# Patient Record
Sex: Female | Born: 1975 | Race: White | Hispanic: No | Marital: Married | Smoking: Never smoker
Health system: Southern US, Community
[De-identification: ages and names within clinical notes are randomized; demographics above are authoritative.]

## PROBLEM LIST (undated history)

## (undated) DIAGNOSIS — O1410 Severe pre-eclampsia, unspecified trimester: Secondary | ICD-10-CM

## (undated) DIAGNOSIS — N809 Endometriosis, unspecified: Secondary | ICD-10-CM

## (undated) DIAGNOSIS — K219 Gastro-esophageal reflux disease without esophagitis: Secondary | ICD-10-CM

## (undated) DIAGNOSIS — N840 Polyp of corpus uteri: Secondary | ICD-10-CM

## (undated) DIAGNOSIS — F419 Anxiety disorder, unspecified: Secondary | ICD-10-CM

## (undated) HISTORY — PX: TUBAL LIGATION: SHX77

## (undated) HISTORY — DX: Polyp of corpus uteri: N84.0

## (undated) HISTORY — DX: Severe pre-eclampsia, unspecified trimester: O14.10

## (undated) HISTORY — DX: Endometriosis, unspecified: N80.9

---

## 1996-12-30 DIAGNOSIS — O1410 Severe pre-eclampsia, unspecified trimester: Secondary | ICD-10-CM

## 1996-12-30 HISTORY — DX: Severe pre-eclampsia, unspecified trimester: O14.10

## 1997-06-18 DIAGNOSIS — O1410 Severe pre-eclampsia, unspecified trimester: Secondary | ICD-10-CM

## 2007-12-31 DIAGNOSIS — N840 Polyp of corpus uteri: Secondary | ICD-10-CM

## 2007-12-31 HISTORY — DX: Polyp of corpus uteri: N84.0

## 2008-12-20 ENCOUNTER — Ambulatory Visit: Payer: Self-pay | Admitting: Unknown Physician Specialty

## 2008-12-20 HISTORY — PX: HYSTEROSCOPY W/D&C: SHX1775

## 2008-12-20 HISTORY — PX: LAPAROSCOPY: SHX197

## 2017-07-10 ENCOUNTER — Encounter: Payer: Self-pay | Admitting: Certified Nurse Midwife

## 2017-07-10 ENCOUNTER — Ambulatory Visit (INDEPENDENT_AMBULATORY_CARE_PROVIDER_SITE_OTHER): Payer: 59 | Admitting: Certified Nurse Midwife

## 2017-07-10 VITALS — BP 112/72 | HR 75 | Ht 62.0 in | Wt 123.0 lb

## 2017-07-10 DIAGNOSIS — N941 Unspecified dyspareunia: Secondary | ICD-10-CM

## 2017-07-10 DIAGNOSIS — N923 Ovulation bleeding: Secondary | ICD-10-CM | POA: Diagnosis not present

## 2017-07-10 DIAGNOSIS — Z8759 Personal history of other complications of pregnancy, childbirth and the puerperium: Secondary | ICD-10-CM

## 2017-07-10 DIAGNOSIS — Z98891 History of uterine scar from previous surgery: Secondary | ICD-10-CM

## 2017-07-10 DIAGNOSIS — N809 Endometriosis, unspecified: Secondary | ICD-10-CM

## 2017-07-10 LAB — POCT WET PREP (WET MOUNT): Trichomonas Wet Prep HPF POC: ABSENT

## 2017-07-10 NOTE — Patient Instructions (Signed)
Transvaginal Ultrasound A transvaginal ultrasound, also called an endovaginal ultrasound, is a test that uses harmless sound waves to take pictures of the female genital tract. The pictures are taken with a device, called a transducer, that is placed in the vagina. This test may be done to:  Check for problems with your pregnancy.  Examine your developing baby.  Check for anything abnormal in the uterus or ovaries.  Evaluate pelvic pain or bleeding.  Tell a health care provider about:  Any allergies you have.  All medicines you are taking, including vitamins, herbs, eye drops, creams, and over-the-counter medicines.  Any blood disorders you have.  Any surgeries you have had.  Any medical conditions you have.  Whether you are pregnant or may be pregnant.  Whether you are having your period. What are the risks? There are no known risks or complications of having this test. What happens before the procedure?  This test needs to be done when your bladder is empty. Follow your health care provider's instructions about drinking fluids and emptying your bladder before the test. What happens during the procedure?  You will empty your bladder.  You will undress from the waist down.  You will lie down on an examining table, with your knees bent and your feet in foot holders.  A health care provider will cover the transducer with a sterile condom.  A gel will be put on the transducer. The gel helps transmit the sound waves and prevents irritation to your vagina.  The technician will insert the transducer into your vagina to get images. These will be displayed on a monitor that looks like a small television screen.  The transducer will be removed when the procedure is complete. What happens after the procedure?  It is your responsibility to get your test results. Ask your health care provider or the department performing the test when your results will be ready.  Keep follow-up  visits as told by your health care provider. This is important. This information is not intended to replace advice given to you by your health care provider. Make sure you discuss any questions you have with your health care provider. Document Released: 11/27/2004 Document Revised: 08/18/2016 Document Reviewed: 05/17/2015 Elsevier Interactive Patient Education  2018 [employer] Abnormal Uterine Bleeding Abnormal uterine bleeding can affect women at various stages in life, including teenagers, women in their reproductive years, pregnant women, and women who have reached menopause. Several kinds of uterine bleeding are considered abnormal, including:  Bleeding or spotting between periods.  Bleeding after sexual intercourse.  Bleeding that is heavier or more than normal.  Periods that last longer than usual.  Bleeding after menopause.  Many cases of abnormal uterine bleeding are minor and simple to treat, while others are more serious. Any type of abnormal bleeding should be evaluated by your health care provider. Treatment will depend on the cause of the bleeding. Follow these instructions at home: Monitor your condition for any changes. The following actions may help to alleviate any discomfort you are experiencing:  Avoid the use of tampons and douches as directed by your health care provider.  Change your pads frequently.  You should get regular pelvic exams and Pap tests. Keep all follow-up appointments for diagnostic tests as directed by your health care provider. Contact a health care provider if:  Your bleeding lasts more than 1 week.  You feel dizzy at times. Get help right away if:  You pass out.  You are changing pads every  15 to 30 minutes.  You have abdominal pain.  You have a fever.  You become sweaty or weak.  You are passing large blood clots from the vagina.  You start to feel nauseous and vomit. This information is not intended to replace advice given  to you by your health care provider. Make sure you discuss any questions you have with your health care provider. Document Released: 12/16/2005 Document Revised: 05/29/2016 Document Reviewed: 07/15/2013 Elsevier Interactive Patient Education  2017 [employer]. Endometriosis Endometriosis is a condition in which the tissue that lines the uterus (endometrium) grows outside of its normal location. The tissue may grow in many locations close to the uterus, but it commonly grows on the ovaries, fallopian tubes, vagina, or bowel. When the uterus sheds the endometrium every menstrual cycle, there is bleeding wherever the endometrial tissue is located. This can cause pain because blood is irritating to tissues that are not normally exposed to it. What are the causes? The cause of endometriosis is not known. What increases the risk? You may be more likely to develop endometriosis if you:  Have a family history of endometriosis.  Have never given birth.  Started your period at age 33 or younger.  Have high levels of estrogen in your body.  Were exposed to a certain medicine (diethylstilbestrol) before you were born (in utero).  Had low birth weight.  Were born as a twin, triplet, or other multiple.  Have a BMI of less than 25. BMI is an estimate of body fat and is calculated from height and weight.  What are the signs or symptoms? Often, there are no symptoms of this condition. If you do have symptoms, they may:  Vary depending on where your endometrial tissue is growing.  Occur during your menstrual period (most common) or midcycle.  Come and go, or you may go months with no symptoms at all.  Stop with menopause.  Symptoms may include:  Pain in the back or abdomen.  Heavier bleeding during periods.  Pain during sex.  Painful bowel movements.  Infertility.  Pelvic pain.  Bleeding more than once a month.  How is this diagnosed? This condition is diagnosed based on your  symptoms and a physical exam. You may have tests, such as:  Blood tests and urine tests. These may be done to help rule out other possible causes of your symptoms.  Ultrasound, to look for abnormal tissues.  An X-ray of the lower bowel (barium enema).  An ultrasound that is done through the vagina (transvaginally).  CT scan.  MRI.  Laparoscopy. In this procedure, a lighted, pencil-sized instrument called a laparoscope is inserted into your abdomen through an incision. The laparoscope allows your health care provider to look at the organs inside your body and check for abnormal tissue to confirm the diagnosis. If abnormal tissue is found, your health care provider may remove a small piece of tissue (biopsy) to be examined under a microscope.  How is this treated? Treatment for this condition may include:  Medicines to relieve pain, such as NSAIDs.  Hormone therapy. This involves using artificial (synthetic) hormones to reduce endometrial tissue growth. Your health care provider may recommend using a hormonal form of birth control, or other medicines.  Surgery. This may be done to remove abnormal endometrial tissue. ? In some cases, tissue may be removed using a laparoscope and a laser (laparoscopic laser treatment). ? In severe cases, surgery may be done to remove the fallopian tubes, uterus, and ovaries (  hysterectomy).  Follow these instructions at home:  Take over-the-counter and prescription medicines only as told by your health care provider.  Do not drive or use heavy machinery while taking prescription pain medicine.  Try to avoid activities that cause pain, including sexual activity.  Keep all follow-up visits as told by your health care provider. This is important. Contact a health care provider if:  You have pain in the area between your hip bones (pelvic area) that occurs: ? Before, during, or after your period. ? In between your period and gets worse during your  period. ? During or after sex. ? With bowel movements or urination, especially during your period.  You have problems getting pregnant.  You have a fever. Get help right away if:  You have severe pain that does not get better with medicine.  You have severe nausea and vomiting, or you cannot eat without vomiting.  You have pain that affects only the lower, right side of your abdomen.  You have abdominal pain that gets worse.  You have abdominal swelling.  You have blood in your stool. This information is not intended to replace advice given to you by your health care provider. Make sure you discuss any questions you have with your health care provider. Document Released: 12/13/2000 Document Revised: 09/20/2016 Document Reviewed: 05/18/2016 Elsevier Interactive Patient Education  [employer].

## 2017-07-10 NOTE — Progress Notes (Signed)
Obstetrics & Gynecology Office Visit   Chief Complaint:  Chief Complaint  Patient presents with  . Menstrual Problem    History of Present Illness: 41 year old G3 P2103 WF, LMP 07/06/2017,  presents with complaints of intermenstrual bleeding x 2 months. Her menses have been monthly +/- a few days, lasting 7 days with the last few days only spotting. About one week after her menses stops, she will have spotting/ light bleeding lasting 2-3 days. The intermenstrual spotting is not accompanied by cramping. She usually has premenstrual lower back pain and/or lower abdominal pain however and when really intense, she takes Tylenol or ibuprofen with relief. Usually she uses topical anlgesics, a warm bath or a heating pad. She has dyspareunia-sometimes experiencing pain in abdomen and sometimes feeling vaginal discomfort. Her history is remarkable for endometriosis, adhesions,  and endometrial polyps diagnosed during a 2009 dx laparoscopy and D&C. She has had 3 Cesarean sections, the first was done preterm for toxemia in 1998. She had a BTL done at the time of her third Cesarean section in 2003.    Review of Systems:  Review of Systems  Constitutional: Negative for chills and fever.  Gastrointestinal: Positive for abdominal pain (premenstrual). Negative for nausea and vomiting.  Genitourinary: Negative for dysuria.       See HPI  Musculoskeletal: Positive for back pain (premenstrually).  Skin: Negative for rash.  Endo/Heme/Allergies:       Positive for heat intolerance and ?night sweats  Psychiatric/Behavioral: The patient has insomnia.        Irritability     Past Medical History:  Past Medical History:  Diagnosis Date  . Endometrial polyp 2009  . Endometriosis   . Preeclampsia, severe 1998    Past Surgical History:  Past Surgical History:  Procedure Laterality Date  . CESAREAN SECTION  06/18/1997 & 07/07/2001  . CESAREAN SECTION W/BTL  11/30/2002  . HYSTEROSCOPY W/D&C  12/20/2008   endomterila polyps  . LAPAROSCOPY  12/20/2008   CPP/ endometriosis/ lysis of adhesion    Gynecologic History: Patient's last menstrual period was 07/06/2017 (exact date).    Family History:  Family History  Problem Relation Age of Onset  . Colon cancer Maternal Uncle   . Ovarian cancer Other     Social History:  Social History   Social History  . Marital status: Married    Spouse name: N/A  . Number of children: 3  . Years of education: N/A   Occupational History  . Not on file.   Social History Main Topics  . Smoking status: Never Smoker  . Smokeless tobacco: Never Used  . Alcohol use No  . Drug use: No  . Sexual activity: Yes    Birth control/ protection: Surgical     Comment: tubal ligation   Other Topics Concern  . Not on file   Social History Narrative  . No narrative on file    Allergies:  Allergies  Allergen Reactions  . Azithromycin Rash  . Penicillin G Other (See Comments) and Rash    All cillins     Medications: Prior to Admission medications   Medication Sig Start Date End Date Taking? Authorizing Provider  cholecalciferol (VITAMIN D) 1000 units tablet Take 1,000 Units by mouth daily.   Yes [provider]    Physical Exam Vitals: BP 112/72   Pulse 75   Ht 5\' 2"  (1.575 m)   Wt 123 lb (55.8 kg)   LMP 07/06/2017 (Exact Date)   BMI  22.50 kg/m   Physical Exam  Constitutional: She is oriented to person, place, and time. She appears well-developed and well-nourished. No distress.  GI: Soft. She exhibits no distension and no mass. There is no tenderness.  Genitourinary:  Genitourinary Comments: Vulva: no lesions or inflammation Vagina: light brown discharge, no lesions Cervix: nullip, NT, no lesions Uterus: RV, NSSC, decreased mobility, essentially NT Adnexa: NT, no masses  Neurological: She is alert and oriented to person, place, and time.  Skin: Skin is warm and dry.  Psychiatric: She has a normal mood and affect. Her  behavior is normal.   Results for orders placed or performed in visit on 07/10/17 (from the past 72 hour(s))  POCT Wet Prep Mellody Drown([place]et [place])     Status: Normal   Collection Time: 07/10/17 10:23 PM  Result Value Ref Range   Source Wet Prep POC vaginal    WBC, Wet Prep HPF POC     Bacteria Wet Prep HPF POC  Few   BACTERIA WET PREP MORPHOLOGY POC     Clue Cells Wet Prep HPF POC None None   Clue Cells Wet Prep Whiff POC     Yeast Wet Prep HPF POC None    KOH Wet Prep POC     Trichomonas Wet Prep HPF POC Absent Absent    Assessment: 41 y.o. Z6X0960G3P2103 with intermenstrual spotting/bleeding x 2 months Histroy of endometriosis/ endometrial polyps/ dyspareunia  Plan: 1)Plan further evaluation of bleeding with sonohystogram with MD 2) Discussed possible etiologies of pain and bleeding 3) CBC and TSH obtained at time of annual in Sept 2017 were normal.  Farrel Connersolleen Toya Palacios, CNM

## 2017-07-13 ENCOUNTER — Encounter: Payer: Self-pay | Admitting: Certified Nurse Midwife

## 2017-07-13 DIAGNOSIS — N941 Unspecified dyspareunia: Secondary | ICD-10-CM | POA: Insufficient documentation

## 2017-07-13 DIAGNOSIS — Z98891 History of uterine scar from previous surgery: Secondary | ICD-10-CM | POA: Insufficient documentation

## 2017-07-15 ENCOUNTER — Ambulatory Visit: Payer: 59 | Admitting: Obstetrics & Gynecology

## 2017-07-15 ENCOUNTER — Other Ambulatory Visit: Payer: 59

## 2017-07-31 ENCOUNTER — Other Ambulatory Visit: Payer: Self-pay | Admitting: Obstetrics and Gynecology

## 2017-07-31 DIAGNOSIS — N923 Ovulation bleeding: Secondary | ICD-10-CM

## 2017-08-04 ENCOUNTER — Ambulatory Visit: Payer: 59 | Admitting: Obstetrics and Gynecology

## 2017-08-04 ENCOUNTER — Other Ambulatory Visit: Payer: 59

## 2017-08-14 ENCOUNTER — Other Ambulatory Visit: Payer: 59

## 2017-08-14 ENCOUNTER — Ambulatory Visit: Payer: 59 | Admitting: Obstetrics and Gynecology

## 2017-09-08 ENCOUNTER — Ambulatory Visit (INDEPENDENT_AMBULATORY_CARE_PROVIDER_SITE_OTHER): Payer: 59 | Admitting: Obstetrics and Gynecology

## 2017-09-08 ENCOUNTER — Encounter: Payer: Self-pay | Admitting: Obstetrics and Gynecology

## 2017-09-08 ENCOUNTER — Ambulatory Visit: Payer: 59 | Admitting: Obstetrics and Gynecology

## 2017-09-08 ENCOUNTER — Other Ambulatory Visit: Payer: 59

## 2017-09-08 ENCOUNTER — Ambulatory Visit (INDEPENDENT_AMBULATORY_CARE_PROVIDER_SITE_OTHER): Payer: 59

## 2017-09-08 VITALS — BP 112/66 | HR 86 | Ht 63.0 in | Wt 122.0 lb

## 2017-09-08 DIAGNOSIS — N809 Endometriosis, unspecified: Secondary | ICD-10-CM

## 2017-09-08 DIAGNOSIS — N840 Polyp of corpus uteri: Secondary | ICD-10-CM | POA: Insufficient documentation

## 2017-09-08 DIAGNOSIS — N923 Ovulation bleeding: Secondary | ICD-10-CM

## 2017-09-08 DIAGNOSIS — N921 Excessive and frequent menstruation with irregular cycle: Secondary | ICD-10-CM

## 2017-09-08 NOTE — Progress Notes (Signed)
   Sonohysterogram Procedure Note  The indications for this procedure were reviewed with the patient. The procedure was explained in detail and all questions were answered.  The patient was placed in the lithotomy position. A graves speculum was introduced into the vagina and the cervix was visualized. The cervix was prepped with iodine solution. A Cook's Hysterography catheter was then introduced into the uterine cavity and the speculum was removed.   Sterile sonohysterography with 3D Reconstruction was performed. The endometrial cavity was distended with sterile saline. The findings are as follows: possible endometrial polyp, larger lesion that could be a blood clot or membrane. No history of uterine membrane from prior hysteroscopy.  The patient tolerated the procedure well without complication, and was discharged to home.  See separate note for discussion of plan.  Thomasene MohairStephen Hyun Marsalis, MD 09/08/2017 6:01 PM

## 2017-09-08 NOTE — Progress Notes (Signed)
Gynecology Ultrasound Follow Up  Chief Complaint:  Chief Complaint  Patient presents with  . Follow-up    SHG     History of Present Illness: Patient is a 41 y.o. female who presents today for ultrasound evaluation of abnormal uterine bleeding (menorrhagia).  Ultrasound demonstrates the following findings Adnexa: no masses noted  Uterus: retroflexed with endometrial stripe  3.3 mm Additional: 10 x 7 mm likely endometrial polyp, additional finding of larger lesion that could be a septum versus well-established blood clot.   She has been having regular, monthly menses lasting 3-4 days with flow followed by a few days of spotting.  For the past several months she has been having mid-cycle spotting that lasts for 7 days.  Denies associated cramping.  She does have some lower back pain and/or lower abdominal pain that has been intense.  She has a history of endometriosis for which she has taken nothing.  She also had endometrial polyps noted when she had a hysteroscopy in 2009.  She has a history of BTL, as well.     Past Medical History:  Diagnosis Date  . Endometrial polyp 2009  . Endometriosis   . Preeclampsia, severe 1998    Past Surgical History:  Procedure Laterality Date  . CESAREAN SECTION  06/18/1997 & 07/07/2001  . CESAREAN SECTION W/BTL  11/30/2002  . HYSTEROSCOPY W/D&C  12/20/2008   endomterila polyps  . LAPAROSCOPY  12/20/2008   CPP/ endometriosis/ lysis of adhesion    Family History  Problem Relation Age of Onset  . Ovarian cancer Other   . COPD Father   . Stroke Paternal Grandfather [id number]  . Colon cancer Paternal Uncle 60       3 of 4 children were tested for genetic mutation-all negative  . Colon cancer Cousin 37       siblings genetic tests were negative  . Congenital heart disease Daughter        Tetrology of Fallot-surgically corrected age 42    Social History   Social History  . Marital status: Married    Spouse name: N/A  . Number of children: 3    . Years of education: N/A   Occupational History  . Not on file.   Social History Main Topics  . Smoking status: Never Smoker  . Smokeless tobacco: Never Used  . Alcohol use No  . Drug use: No  . Sexual activity: Yes    Birth control/ protection: Surgical     Comment: tubal ligation   Other Topics Concern  . Not on file   Social History Narrative  . No narrative on file    Allergies  Allergen Reactions  . Azithromycin Rash  . Penicillin G Other (See Comments) and Rash    All cillins     Prior to Admission medications   Medication Sig Start Date End Date Taking? Authorizing Provider  cholecalciferol (VITAMIN D) 1000 units tablet Take 1,000 Units by mouth daily.   Yes [provider]    Physical Exam BP 112/66 (BP Location: Left Arm, Patient Position: Sitting, Cuff Size: Normal)   Pulse 86   Ht 5\' 3"  (1.6 m)   Wt 122 lb (55.3 kg)   LMP 09/02/2017   BMI 21.61 kg/m    General: NAD HEENT: normocephalic, anicteric Pulmonary: No increased work of breathing Extremities: no edema, erythema, or tenderness Neurologic: Grossly intact, normal gait Psychiatric: mood appropriate, affect full  Assessment: 41 y.o. [id number] with menorrhagia and irregular cycle, as well  as endometrial polyp.  Plan: Problem List Items Addressed This Visit    Endometriosis determined by laparoscopy   Menorrhagia with irregular cycle - Primary   Relevant Orders   [place] Sonohysterogram   Endometrial polyp   Relevant Orders   [place] Sonohysterogram     I spent 15 minutes discussing her abnormal findings and discussion of management options. Ultimately decision was made to undergo surgery with hysteroscopy, dilation and curettage, as well as polypectomy.  Will schedule.  Thomasene Mohair, MD 09/08/2017 6:09 PM

## 2017-09-09 ENCOUNTER — Telehealth: Payer: Self-pay | Admitting: Obstetrics and Gynecology

## 2017-09-09 NOTE — Telephone Encounter (Signed)
Upcoming available OR dates given. Patient prefers a Thursday; will check calendar and call back, most likely tomorrow. Ext given.

## 2017-09-09 NOTE — Telephone Encounter (Signed)
-----   Message from Conard NovakStephen D Jackson, MD sent at 09/08/2017  6:09 PM EDT ----- Regarding: Schedule Surgery Surgery Booking Request Patient Full Name:   MRN: [id number]  DOB: 10/19/1976  Surgeon: Thomasene MohairStephen Jackson, MD  Requested Surgery Date and Time: within next 30 days Primary Diagnosis AND Code: menorrhagia with irregular cycle, endometrial polyp Secondary Diagnosis and Code:  Surgical Procedure: hysteroscopy, dilation and curettage, polypectomy L&D Notification: No Admission Status: same day surgery Length of Surgery: 40 minutes Special Case Needs: MyoSure device H&P: TBD (date) (can sign consents pre-op at hospital) Phone Interview???: yes Interpreter: Language:  Medical Clearance: none required Special Scheduling Instructions: none

## 2017-09-10 NOTE — Telephone Encounter (Signed)
Patient l/m that she would like to have the surgery on 10/02/17. Patient would like to know her estimated recovery time. No answer, v/m not set up.

## 2017-09-11 NOTE — Telephone Encounter (Signed)
Patient is aware of Pre-admit Testing phone interview on 09/25/17 1-5pm and recovery time. Patient will let us know if she needs anything for work.

## 2017-09-25 ENCOUNTER — Encounter
Admission: RE | Admit: 2017-09-25 | Discharge: 2017-09-25 | Disposition: A | Discharge status: Home / Self Care | Payer: 59 | Source: Ambulatory Visit | Attending: Obstetrics and Gynecology | Admitting: Obstetrics and Gynecology

## 2017-09-25 HISTORY — DX: Anxiety disorder, unspecified: F41.9

## 2017-09-25 HISTORY — DX: Gastro-esophageal reflux disease without esophagitis: K21.9

## 2017-09-25 NOTE — Patient Instructions (Signed)
Your procedure is scheduled on: 10-02-17  Report to Same Day Surgery 2nd floor medical mall [facility] Entrance-take elevator on left to 2nd floor.  Check in with surgery information desk.) To find out your arrival time please call [phone] between 1PM - 3PM on 10-01-17  Remember: Instructions that are not followed completely may result in serious medical risk, up to and including death, or upon the discretion of your surgeon and anesthesiologist your surgery may need to be rescheduled.    _x___ 1. Do not eat food after midnight the night before your procedure. You may drink clear liquids up to 2 hours before you are scheduled to arrive at the hospital for your procedure.  Do not drink clear liquids within 2 hours of your scheduled arrival to the hospital.  Clear liquids include  --Water or Apple juice without pulp  --Clear carbohydrate beverage such as ClearFast or Gatorade  --Black Coffee or Clear Tea (No milk, no creamers, do not add anything to  the coffee or Tea Type 1 and type 2 diabetics should only drink water.  No gum chewing or hard candies.     __x__ 2. No Alcohol for 24 hours before or after surgery.   __x__3. No Smoking for 24 prior to surgery.   ____  4. Bring all medications with you on the day of surgery if instructed.    __x__ 5. Notify your doctor if there is any change in your medical condition     (cold, fever, infections).     Do not wear jewelry, make-up, hairpins, clips or nail polish.  Do not wear lotions, powders, or perfumes. You may wear deodorant.  Do not shave 48 hours prior to surgery. Men may shave face and neck.  Do not bring valuables to the hospital.    [facility] is not responsible for any belongings or valuables.               Contacts, dentures or bridgework may not be worn into surgery.  Leave your suitcase in the car. After surgery it may be brought to your room.  For patients admitted to the hospital, discharge time is determined by  your                       treatment team.   Patients discharged the day of surgery will not be allowed to drive home.  You will need someone to drive you home and stay with you the night of your procedure.    Please read over the following fact sheets that you were given:   [facility] Preparing for Surgery and or MRSA Information   ____ Take anti-hypertensive listed below, cardiac, seizure, asthma,     anti-reflux and psychiatric medicines. These include:  1. NONE  2.  3.  4.  5.  6.  ____Fleets enema or Magnesium Citrate as directed.   ____ Use CHG Soap or sage wipes as directed on instruction sheet   ____ Use inhalers on the day of surgery and bring to hospital day of surgery  ____ Stop Metformin and Janumet 2 days prior to surgery.    ____ Take 1/2 of usual insulin dose the night before surgery and none on the morning     surgery.   ____ Follow recommendations from Cardiologist, Pulmonologist or PCP regarding stopping Aspirin, Coumadin, Plavix ,Eliquis, Effient, or Pradaxa, and Pletal.  X____Stop Anti-inflammatories such as Advil, Aleve, IBUPROFEN, Motrin, Naproxen, Naprosyn, Goodies powders or aspirin  products NOW- OK to take Tylenol    ____ Stop supplements until after surgery.    ____ Bring C-Pap to the hospital.

## 2017-09-30 ENCOUNTER — Telehealth: Payer: Self-pay | Admitting: Obstetrics and Gynecology

## 2017-09-30 NOTE — Telephone Encounter (Signed)
Pt is calling today about her surgery date and time. Pre op called and never called back with date and time. Will you please advise patient. RU#[phone]

## 2017-10-02 ENCOUNTER — Ambulatory Visit: Payer: 59 | Admitting: Registered Nurse

## 2017-10-02 ENCOUNTER — Encounter
Admission: RE | Disposition: A | Discharge status: Home / Self Care | Payer: Self-pay | Source: Ambulatory Visit | Attending: Obstetrics and Gynecology

## 2017-10-02 ENCOUNTER — Ambulatory Visit
Admission: RE | Admit: 2017-10-02 | Discharge: 2017-10-02 | Disposition: A | Discharge status: Home / Self Care | Payer: 59 | Source: Ambulatory Visit | Attending: Obstetrics and Gynecology | Admitting: Obstetrics and Gynecology

## 2017-10-02 ENCOUNTER — Encounter: Payer: Self-pay | Admitting: Emergency Medicine

## 2017-10-02 DIAGNOSIS — N921 Excessive and frequent menstruation with irregular cycle: Secondary | ICD-10-CM

## 2017-10-02 DIAGNOSIS — N840 Polyp of corpus uteri: Secondary | ICD-10-CM | POA: Insufficient documentation

## 2017-10-02 DIAGNOSIS — I10 Essential (primary) hypertension: Secondary | ICD-10-CM | POA: Insufficient documentation

## 2017-10-02 HISTORY — PX: DILATATION & CURETTAGE/HYSTEROSCOPY WITH MYOSURE: SHX6511

## 2017-10-02 LAB — POCT PREGNANCY, URINE: Preg Test, Ur: NEGATIVE

## 2017-10-02 SURGERY — DILATATION & CURETTAGE/HYSTEROSCOPY WITH MYOSURE
Anesthesia: General

## 2017-10-02 MED ORDER — MIDAZOLAM HCL 2 MG/2ML IJ SOLN
INTRAMUSCULAR | Status: DC | PRN
  Administered 2017-10-02: 2 mg via INTRAVENOUS

## 2017-10-02 MED ORDER — FENTANYL CITRATE (PF) 100 MCG/2ML IJ SOLN
INTRAMUSCULAR | Status: DC | PRN
  Administered 2017-10-02 (×2): 25 ug via INTRAVENOUS

## 2017-10-02 MED ORDER — FAMOTIDINE 20 MG PO TABS
ORAL_TABLET | ORAL | Status: AC
  Filled 2017-10-02: qty 1

## 2017-10-02 MED ORDER — FENTANYL CITRATE (PF) 100 MCG/2ML IJ SOLN
INTRAMUSCULAR | Status: AC
  Filled 2017-10-02: qty 2

## 2017-10-02 MED ORDER — ACETAMINOPHEN NICU IV SYRINGE 10 MG/ML
INTRAVENOUS | Status: AC
  Filled 2017-10-02: qty 1

## 2017-10-02 MED ORDER — ONDANSETRON HCL 4 MG/2ML IJ SOLN
INTRAMUSCULAR | Status: DC | PRN
  Administered 2017-10-02: 4 mg via INTRAVENOUS

## 2017-10-02 MED ORDER — ONDANSETRON HCL 4 MG/2ML IJ SOLN
INTRAMUSCULAR | Status: AC
  Filled 2017-10-02: qty 2

## 2017-10-02 MED ORDER — LIDOCAINE HCL (CARDIAC) 20 MG/ML IV SOLN
INTRAVENOUS | Status: DC | PRN
  Administered 2017-10-02: 60 mg via INTRAVENOUS

## 2017-10-02 MED ORDER — FENTANYL CITRATE (PF) 100 MCG/2ML IJ SOLN
INTRAMUSCULAR | Status: AC
  Administered 2017-10-02: 25 ug via INTRAVENOUS
  Filled 2017-10-02: qty 2

## 2017-10-02 MED ORDER — DEXAMETHASONE SODIUM PHOSPHATE 10 MG/ML IJ SOLN
INTRAMUSCULAR | Status: DC | PRN
  Administered 2017-10-02: 5 mg via INTRAVENOUS

## 2017-10-02 MED ORDER — PHENYLEPHRINE HCL 10 MG/ML IJ SOLN
INTRAMUSCULAR | Status: DC | PRN
  Administered 2017-10-02 (×2): 50 ug via INTRAVENOUS
  Administered 2017-10-02: 100 ug via INTRAVENOUS

## 2017-10-02 MED ORDER — PROPOFOL 10 MG/ML IV BOLUS
INTRAVENOUS | Status: AC
  Filled 2017-10-02: qty 20

## 2017-10-02 MED ORDER — SILVER NITRATE-POT NITRATE 75-25 % EX MISC
CUTANEOUS | Status: DC | PRN
  Administered 2017-10-02: 1 via TOPICAL

## 2017-10-02 MED ORDER — FENTANYL CITRATE (PF) 100 MCG/2ML IJ SOLN
25.0000 ug | INTRAMUSCULAR | Status: DC | PRN
  Administered 2017-10-02 (×4): 25 ug via INTRAVENOUS

## 2017-10-02 MED ORDER — FAMOTIDINE 20 MG PO TABS: 20.0000 mg | ORAL_TABLET | Freq: Once | ORAL | Status: DC

## 2017-10-02 MED ORDER — LACTATED RINGERS IV SOLN
INTRAVENOUS | Status: DC
  Administered 2017-10-02: 11:00:00 via INTRAVENOUS

## 2017-10-02 MED ORDER — DEXAMETHASONE SODIUM PHOSPHATE 10 MG/ML IJ SOLN
INTRAMUSCULAR | Status: AC
  Filled 2017-10-02: qty 1

## 2017-10-02 MED ORDER — LIDOCAINE HCL (PF) 2 % IJ SOLN
INTRAMUSCULAR | Status: AC
  Filled 2017-10-02: qty 2

## 2017-10-02 MED ORDER — MIDAZOLAM HCL 2 MG/2ML IJ SOLN
INTRAMUSCULAR | Status: AC
  Filled 2017-10-02: qty 2

## 2017-10-02 MED ORDER — ONDANSETRON HCL 4 MG/2ML IJ SOLN: 4.0000 mg | Freq: Once | INTRAMUSCULAR | Status: DC | PRN

## 2017-10-02 MED ORDER — IBUPROFEN 800 MG PO TABS: 800.0000 mg | ORAL_TABLET | Freq: Three times a day (TID) | ORAL | 0 refills | Status: DC | PRN

## 2017-10-02 MED ORDER — PROPOFOL 10 MG/ML IV BOLUS
INTRAVENOUS | Status: DC | PRN
  Administered 2017-10-02: 130 mg via INTRAVENOUS
  Administered 2017-10-02: 40 mg via INTRAVENOUS

## 2017-10-02 MED ORDER — SUCCINYLCHOLINE CHLORIDE 20 MG/ML IJ SOLN
INTRAMUSCULAR | Status: AC
  Filled 2017-10-02: qty 1

## 2017-10-02 SURGICAL SUPPLY — 23 items
ABLATOR ENDOMETRIAL MYOSURE (ABLATOR) IMPLANT
BAG URO DRAIN 2000ML W/SPOUT (MISCELLANEOUS) IMPLANT
CANISTER SUC SOCK COL 7IN (MISCELLANEOUS) ×3 IMPLANT
CATH FOLEY 2WAY  5CC 16FR (CATHETERS) ×2
CATH ROBINSON RED A/P 16FR (CATHETERS) ×3 IMPLANT
CATH URTH 16FR FL 2W BLN LF (CATHETERS) ×1 IMPLANT
DEVICE MYOSURE LITE (MISCELLANEOUS) IMPLANT
ELECT REM PT RETURN 9FT ADLT (ELECTROSURGICAL) ×3
ELECTRODE REM PT RTRN 9FT ADLT (ELECTROSURGICAL) ×1 IMPLANT
GLOVE BIO SURGEON STRL SZ7 (GLOVE) ×12 IMPLANT
GLOVE BIOGEL PI IND STRL 7.5 (GLOVE) ×4 IMPLANT
GLOVE BIOGEL PI INDICATOR 7.5 (GLOVE) ×8
GOWN STRL REUS W/ TWL LRG LVL3 (GOWN DISPOSABLE) ×1 IMPLANT
GOWN STRL REUS W/TWL LRG LVL3 (GOWN DISPOSABLE) ×2
IV LACTATED RINGER IRRG 3000ML (IV SOLUTION) ×2
IV LR IRRIG 3000ML ARTHROMATIC (IV SOLUTION) ×1 IMPLANT
KIT RM TURNOVER CYSTO AR (KITS) ×3 IMPLANT
PACK DNC HYST (MISCELLANEOUS) ×3 IMPLANT
PAD OB MATERNITY 4.3X12.25 (PERSONAL CARE ITEMS) ×3 IMPLANT
PAD PREP 24X41 OB/GYN DISP (PERSONAL CARE ITEMS) ×3 IMPLANT
TUBING CONNECTING 10 (TUBING) ×2 IMPLANT
TUBING CONNECTING 10' (TUBING) ×1
TUBING HYSTEROSCOPY DOLPHIN (MISCELLANEOUS) ×3 IMPLANT

## 2017-10-02 NOTE — Anesthesia Preprocedure Evaluation (Signed)
Anesthesia Evaluation  Patient identified by MRN, date of birth, ID band Patient awake    Reviewed: Allergy & Precautions, NPO status , Patient's Chart, lab work & pertinent test results  Airway Mallampati: II  TM Distance: >3 FB     Dental  (+) Teeth Intact   Pulmonary neg pulmonary ROS,    Pulmonary exam normal        Cardiovascular hypertension, Pt. on medications Normal cardiovascular exam     Neuro/Psych Anxiety negative neurological ROS     GI/Hepatic Neg liver ROS, GERD  ,  Endo/Other  negative endocrine ROS  Renal/GU negative Renal ROS  Female GU complaint     Musculoskeletal negative musculoskeletal ROS (+)   Abdominal Normal abdominal exam  (+)   Peds negative pediatric ROS (+)  Hematology negative hematology ROS (+)   Anesthesia Other Findings   Reproductive/Obstetrics                             Anesthesia Physical Anesthesia Plan  ASA: II  Anesthesia Plan: General   Post-op Pain Management:    Induction: Intravenous  PONV Risk Score and Plan:   Airway Management Planned: LMA and Oral ETT  Additional Equipment:   Intra-op Plan:   Post-operative Plan: Extubation in OR  Informed Consent: I have reviewed the patients History and Physical, chart, labs and discussed the procedure including the risks, benefits and alternatives for the proposed anesthesia with the patient or authorized representative who has indicated his/her understanding and acceptance.   Dental advisory given  Plan Discussed with: CRNA and Surgeon  Anesthesia Plan Comments:         Anesthesia Quick Evaluation

## 2017-10-02 NOTE — Transfer of Care (Signed)
Immediate Anesthesia Transfer of Care Note  Patient: Sara Pacheco  Procedure(s) Performed: DILATATION & CURETTAGE/HYSTEROSCOPY WITH MYOSURE, POLYPECTOMY (N/A )  Patient Location: PACU  Anesthesia Type:General  Level of Consciousness: awake, alert  and oriented  Airway & Oxygen Therapy: Patient Spontanous Breathing and Patient connected to face mask oxygen  Post-op Assessment: Report given to RN and Post -op Vital signs reviewed and stable  Post vital signs: Reviewed and stable  Last Vitals:  Vitals:   10/02/17 1111 10/02/17 1359  BP: 139/82 (!) 95/49  Pulse: 88 64  Resp: 18 (!) 22  Temp: (!) 36.2 C   SpO2: 100% 100%    Last Pain:  Vitals:   10/02/17 1111  TempSrc: Tympanic         Complications: No apparent anesthesia complications

## 2017-10-02 NOTE — H&P (View-Only) (Signed)
   Sonohysterogram Procedure Note  The indications for this procedure were reviewed with the patient. The procedure was explained in detail and all questions were answered.  The patient was placed in the lithotomy position. A graves speculum was introduced into the vagina and the cervix was visualized. The cervix was prepped with iodine solution. A Cook's Hysterography catheter was then introduced into the uterine cavity and the speculum was removed.   Sterile sonohysterography with 3D Reconstruction was performed. The endometrial cavity was distended with sterile saline. The findings are as follows: possible endometrial polyp, larger lesion that could be a blood clot or membrane. No history of uterine membrane from prior hysteroscopy.  The patient tolerated the procedure well without complication, and was discharged to home.  See separate note for discussion of plan.  Stephen Jackson, MD 09/08/2017 6:01 PM      

## 2017-10-02 NOTE — Op Note (Signed)
  Operative Note   10/02/2017  PRE-OP DIAGNOSIS:  1) Menorrhagia with irregular cycle 2) endometrial polyp   POST-OP DIAGNOSIS:  1) Menorrhagia with irregular cycle 2) endometrial polyp   SURGEON: Surgeon(s) and Role:    Conard Novak, MD - Primary   PROCEDURE: Procedure(s): 1) DILATATION & CURETTAGE/HYSTEROSCOPY WITH MYOSURE 2) POLYPECTOMY   ANESTHESIA: General  ESTIMATED BLOOD LOSS: 50 mL  DRAINS: none   TOTAL IV FLUIDS: 500 mL crystalloid  SPECIMENS:  Endometrial curettings, endometrial polyp  VTE PROPHYLAXIS: SCDs to the bilateral lower extremities  ANTIBIOTICS: none indicated, none given  FLUID DEFICIT: 50 mL  COMPLICATIONS: none  DISPOSITION: PACU - hemodynamically stable.  CONDITION: stable  INDICATION: 41 y.o. G90P2103 female with intermenstrual bleeding, suspected polyp on ultrasound  FINDINGS: Exam under anesthesia revealed small, mobile retroverted uterus with no masses and bilateral adnexa without masses or fullness. Hysteroscopy revealed a normal cavity shape and contour with a large polypoid lesion originating at what appeared to be the fundus.   PROCEDURE IN DETAIL:  After informed consent was obtained, the patient was taken to the operating room where anesthesia was obtained without difficulty. The patient was positioned in the dorsal lithotomy position in Paris stirrups.  The patient's bladder was catheterized with an in and out foley catheter.  The patient was examined under anesthesia, with the above noted findings.  The bi-valved speculum was placed inside the patient's vagina, and the the anterior lip of the cervix was seen and grasped with the tenaculum.  The cervix was progressively dilated to a 7mm Hegar dilator.  The hysteroscope was introduced, with the above noted findings. The Myosure device was attached to the hysteroscope and removal of the polyp was undertaken.  All polypoid and shaggy endometrial tissue was removed. The hysteroscope  was removed and the uterine cavity was curetted until a gritty texture was noted.  The hysteroscope was introduced again and pressure from the scope fluid was reduced to that below the mean arterial pressure with hemostasis noted.  The hysteroscope was removed and no bleeding noted at cervical Os. The single-tooth tenaculum was removed and hemostasis was noted.  All instruments and sponges were removed and the vagina was verified to be free of any remaining instrumentation.  The patient tolerated the procedure well.  Sponge, lap and needle counts were correct x2.  The patient was taken to recovery room in excellent condition.  Conard Novak, MD, FACOG 10/02/2017 1:48 PM

## 2017-10-02 NOTE — Discharge Instructions (Signed)

## 2017-10-02 NOTE — Anesthesia Procedure Notes (Signed)
Procedure Name: LMA Insertion Date/Time: 10/02/2017 12:44 PM Performed by: Karoline Caldwell Pre-anesthesia Checklist: Patient identified, Patient being monitored, Timeout performed, Emergency Drugs available and Suction available Patient Re-evaluated:Patient Re-evaluated prior to induction Oxygen Delivery Method: Circle system utilized Preoxygenation: Pre-oxygenation with 100% oxygen Induction Type: IV induction Ventilation: Mask ventilation without difficulty LMA: LMA inserted LMA Size: 3.5 Tube type: Oral Number of attempts: 1 Placement Confirmation: positive ETCO2 and breath sounds checked- equal and bilateral Tube secured with: Tape Dental Injury: Teeth and Oropharynx as per pre-operative assessment

## 2017-10-02 NOTE — Anesthesia Postprocedure Evaluation (Signed)
Anesthesia Post Note  Patient: Idonna N Weisensel  Procedure(s) Performed: DILATATION & CURETTAGE/HYSTEROSCOPY WITH MYOSURE, POLYPECTOMY (N/A )  Patient location during evaluation: PACU Anesthesia Type: General Level of consciousness: awake and alert and oriented Pain management: pain level controlled Vital Signs Assessment: post-procedure vital signs reviewed and stable Respiratory status: spontaneous breathing, nonlabored ventilation and respiratory function stable Cardiovascular status: blood pressure returned to baseline and stable Postop Assessment: no signs of nausea or vomiting Anesthetic complications: no     Last Vitals:  Vitals:   10/02/17 1444 10/02/17 1458  BP: 123/78 129/66  Pulse: 93 95  Resp: 13 16  Temp:  37.1 C  SpO2: 99% 99%    Last Pain:  Vitals:   10/02/17 1458  TempSrc: Temporal  PainSc:                  Azya Barbero

## 2017-10-02 NOTE — Anesthesia Post-op Follow-up Note (Signed)
Anesthesia QCDR form completed.        

## 2017-10-02 NOTE — Interval H&P Note (Signed)
History and Physical Interval Note:  10/02/2017 12:18 PM  Sara Pacheco  has presented today for surgery, with the diagnosis of MENORRHAGIA WITH IRREGULAR CYCLE,ENDOMETRIAL POLYP  The various methods of treatment have been discussed with the patient and family. After consideration of risks, benefits and other options for treatment, the patient has consented to  Procedure(s): DILATATION & CURETTAGE/HYSTEROSCOPY WITH MYOSURE, POLYPECTOMY (N/A) as a surgical intervention .  The patient's history has been reviewed, patient examined, no change in status, stable for surgery.  I have reviewed the patient's chart and labs.  Questions were answered to the patient's satisfaction.    Thomasene Mohair, MD 10/02/2017 12:19 PM

## 2017-10-03 ENCOUNTER — Telehealth: Payer: Self-pay | Admitting: Obstetrics and Gynecology

## 2017-10-03 ENCOUNTER — Encounter: Payer: Self-pay | Admitting: Obstetrics and Gynecology

## 2017-10-03 LAB — SURGICAL PATHOLOGY

## 2017-10-03 NOTE — Telephone Encounter (Signed)
Attempted to call, but VM not set up.

## 2017-10-06 NOTE — Telephone Encounter (Signed)
Please let me know when pt calls [place] back. Tried to call this AM as I left early Friday PM. No answer and no vm

## 2017-10-07 NOTE — Telephone Encounter (Signed)
Please let us know when pt calls back, cannot get in touch with her.

## 2017-10-15 ENCOUNTER — Ambulatory Visit: Payer: 59 | Admitting: Obstetrics and Gynecology

## 2017-10-15 ENCOUNTER — Ambulatory Visit (INDEPENDENT_AMBULATORY_CARE_PROVIDER_SITE_OTHER): Payer: 59 | Admitting: Obstetrics and Gynecology

## 2017-10-15 ENCOUNTER — Encounter: Payer: Self-pay | Admitting: Obstetrics and Gynecology

## 2017-10-15 VITALS — BP 114/78 | Ht 62.0 in | Wt 120.0 lb

## 2017-10-15 DIAGNOSIS — N840 Polyp of corpus uteri: Secondary | ICD-10-CM

## 2017-10-15 DIAGNOSIS — N921 Excessive and frequent menstruation with irregular cycle: Secondary | ICD-10-CM

## 2017-10-15 NOTE — Progress Notes (Signed)
   Postoperative Follow-up Patient presents post op from hysteroscopy, dilation and curettage, polypectomy  2weeks ago for abnormal uterine bleeding with mid-cycle spotting, endometrial polyp.  Subjective: Patient reports marked improvement in her preop symptoms. Eating a regular diet without difficulty. The patient is not having any pain.  Activity: normal activities of daily living.  Objective: Vitals:   10/15/17 0851  BP: 114/78   Vital Signs: BP 114/78   Ht 5\' 2"  (1.575 m)   Wt 120 lb (54.4 kg)   LMP 10/02/2017 (Exact Date)   BMI 21.95 kg/m  Constitutional: Well nourished, well developed female in no acute distress.  HEENT: normal Skin: Warm and dry.  Extremity: no edema  Abdomen: Soft, non-tender, normal bowel sounds; no bruits, organomegaly or masses.   Assessment: 41 y.o. s/p hysteroscopy, D&C, polypectomy progressing well  Plan: Patient has done well after surgery with no apparent complications.  I have discussed the post-operative course to date, and the expected progress moving forward.  The patient understands what complications to be concerned about.  I will see the patient in routine follow up, or sooner if needed.    Activity plan: No restriction.  Thomasene MohairStephen Lometa Riggin, MD 10/15/2017, 8:57 AM

## 2017-12-04 ENCOUNTER — Ambulatory Visit (INDEPENDENT_AMBULATORY_CARE_PROVIDER_SITE_OTHER): Payer: 59 | Admitting: Obstetrics and Gynecology

## 2017-12-04 ENCOUNTER — Encounter: Payer: Self-pay | Admitting: Obstetrics and Gynecology

## 2017-12-04 VITALS — BP 130/80 | HR 66 | Ht 62.5 in | Wt 119.0 lb

## 2017-12-04 DIAGNOSIS — N760 Acute vaginitis: Secondary | ICD-10-CM | POA: Diagnosis not present

## 2017-12-04 DIAGNOSIS — Z0000 Encounter for general adult medical examination without abnormal findings: Secondary | ICD-10-CM

## 2017-12-04 DIAGNOSIS — Z1231 Encounter for screening mammogram for malignant neoplasm of breast: Secondary | ICD-10-CM | POA: Diagnosis not present

## 2017-12-04 DIAGNOSIS — L292 Pruritus vulvae: Secondary | ICD-10-CM

## 2017-12-04 DIAGNOSIS — Z1239 Encounter for other screening for malignant neoplasm of breast: Secondary | ICD-10-CM

## 2017-12-04 DIAGNOSIS — Z01419 Encounter for gynecological examination (general) (routine) without abnormal findings: Secondary | ICD-10-CM | POA: Diagnosis not present

## 2017-12-07 ENCOUNTER — Encounter: Payer: Self-pay | Admitting: Obstetrics and Gynecology

## 2017-12-07 DIAGNOSIS — L292 Pruritus vulvae: Secondary | ICD-10-CM | POA: Insufficient documentation

## 2017-12-07 MED ORDER — HYDROXYZINE HCL 25 MG PO TABS: 25.0000 mg | ORAL_TABLET | Freq: Every evening | ORAL | 2 refills | Status: DC | PRN

## 2017-12-07 NOTE — Progress Notes (Signed)
Gynecology Annual Exam  PCP: Patient, No Pcp Per  Chief Complaint:  Chief Complaint  Patient presents with  . Gynecologic Exam    History of Present Illness: Patient is a 41 y.o. [id number] presents for annual exam. The patient complains today of vulvar itching. She has been using soap on her perineum. She has been attending a spin class, so she isn't sure if that is making it worse. Nothing seems to make it better. She has not attempted any treatments yet.  LMP: Patient's last menstrual period was 11/28/2017 (exact date). Average Interval: regular, 30 days Duration of flow: 5 days Heavy Menses: no Clots: no Intermenstrual Bleeding: not applicable Postcoital Bleeding: no Dysmenorrhea: no   The patient is sexually active. She currently uses tubal ligation for contraception. She denies dyspareunia.  The patient does perform self breast exams.  There is no notable family history of breast or ovarian cancer in her family.  The patient wears seatbelts: yes.   The patient has regular exercise: yes.    The patient denies current symptoms of depression.    Review of Systems: Review of Systems  Constitutional: Negative for chills, fever, malaise/fatigue and weight loss.  HENT: Negative for congestion, hearing loss and sinus pain.   Eyes: Negative for blurred vision and double vision.  Respiratory: Negative for cough, sputum production, shortness of breath and wheezing.   Cardiovascular: Negative for chest pain, palpitations, orthopnea and leg swelling.  Gastrointestinal: Negative for abdominal pain, constipation, diarrhea, nausea and vomiting.  Genitourinary: Negative for dysuria, flank pain, frequency, hematuria and urgency.  Musculoskeletal: Negative for back pain, falls and joint pain.  Skin: Negative for itching and rash.  Neurological: Negative for dizziness and headaches.  Psychiatric/Behavioral: Negative for depression, substance abuse and suicidal ideas. The patient is not  nervous/anxious.     Past Medical History:  Past Medical History:  Diagnosis Date  . Anxiety    NO MEDS  . Endometrial polyp 2009  . Endometriosis   . GERD (gastroesophageal reflux disease)    no meds  . Preeclampsia, severe 1998    Past Surgical History:  Past Surgical History:  Procedure Laterality Date  . CESAREAN SECTION  06/18/1997 & 07/07/2001   X3  . CESAREAN SECTION W/BTL  11/30/2002  . DILATATION & CURETTAGE/HYSTEROSCOPY WITH MYOSURE N/A 10/02/2017   Procedure: DILATATION & CURETTAGE/HYSTEROSCOPY WITH MYOSURE, POLYPECTOMY;  Surgeon: Conard NovakJackson, Stephen D, MD;  Location: [facility] ORS;  Service: Gynecology;  Laterality: N/A;  . HYSTEROSCOPY W/D&C  12/20/2008   endomterila polyps  . LAPAROSCOPY  12/20/2008   CPP/ endometriosis/ lysis of adhesion  . TUBAL LIGATION      Gynecologic History:  Patient's last menstrual period was 11/28/2017 (exact date). Contraception: tubal ligation Last Pap: Results were: 09/25/2016 NIL and HR HPV negative  Last mammogram: Never  Obstetric History: [id number]  Family History:  Family History  Problem Relation Age of Onset  . Ovarian cancer Other   . COPD Father   . Stroke Paternal Grandfather [id number]  . Colon cancer Paternal Uncle 60       3 of 4 children were tested for genetic mutation-all negative  . Colon cancer Cousin 37       siblings genetic tests were negative  . Congenital heart disease Daughter        Tetrology of Fallot-surgically corrected age 42    Social History:  Social History   Socioeconomic History  . Marital status: Married    Spouse name: Not on file  .  Number of children: 3  . Years of education: Not on file  . Highest education level: Not on file  Social Needs  . Financial resource strain: Not on file  . Food insecurity - worry: Not on file  . Food insecurity - inability: Not on file  . Transportation needs - medical: Not on file  . Transportation needs - non-medical: Not on file  Occupational History  . Not on  file  Tobacco Use  . Smoking status: Never Smoker  . Smokeless tobacco: Never Used  Substance and Sexual Activity  . Alcohol use: No  . Drug use: No  . Sexual activity: Yes    Birth control/protection: Surgical    Comment: tubal ligation  Other Topics Concern  . Not on file  Social History Narrative  . Not on file    Allergies:  Allergies  Allergen Reactions  . Azithromycin Rash  . Penicillin G Rash and Other (See Comments)    All cillins  Has patient had a PCN reaction causing immediate rash, facial/tongue/throat swelling, SOB or lightheadedness with hypotension: No Has patient had a PCN reaction causing severe rash involving mucus membranes or skin necrosis: No Has patient had a PCN reaction that required hospitalization: No Has patient had a PCN reaction occurring within the last 10 years:No If all of the above answers are "NO", then may proceed with Cephalosporin use.  All cillins     Medications: Prior to Admission medications   Medication Sig Start Date End Date Taking? Authorizing Provider  acetaminophen (TYLENOL) 500 MG tablet Take 500 mg by mouth every 6 (six) hours as needed (for endometriosis pain.).   Yes [provider]  cholecalciferol (VITAMIN D) 1000 units tablet Take 1,000 Units by mouth daily.   Yes [provider]  ibuprofen (ADVIL,MOTRIN) 800 MG tablet Take 1 tablet (800 mg total) by mouth every 8 (eight) hours as needed for mild pain or cramping. 10/02/17  Yes Conard Novak, MD  hydrOXYzine (ATARAX/VISTARIL) 25 MG tablet Take 1 tablet (25 mg total) by mouth at bedtime as needed for itching. 12/07/17   Natale Milch, MD    Physical Exam Vitals: Blood pressure 130/80, pulse 66, height 5' 2.5" (1.588 m), weight 119 lb (54 kg), last menstrual period 11/28/2017.  Physical Exam  Constitutional: She is oriented to person, place, and time. She appears well-developed.  Genitourinary: Vagina normal and uterus normal. Pelvic exam was  performed with patient supine. No labial fusion. There is no rash, tenderness, lesion or injury on the right labia. There is no rash, tenderness, lesion or injury on the left labia. Vagina exhibits no lesion. Right adnexum does not display mass. Left adnexum does not display mass. Cervix does not exhibit motion tenderness.   Uterus is anteverted. Uterus is not tender or irregular (is regular).  Genitourinary Comments: Some thinning of skin, perhaps suggestive of lichen simplex.  HENT:  Head: Normocephalic and atraumatic.  Eyes: EOM are normal.  Neck: Neck supple. No thyromegaly present.  Cardiovascular: Normal rate, regular rhythm and normal heart sounds.  Pulmonary/Chest: Effort normal and breath sounds normal. Right breast exhibits no inverted nipple, no mass, no nipple discharge and no skin change. Left breast exhibits no inverted nipple, no mass, no nipple discharge and no skin change.  Abdominal: Soft. Bowel sounds are normal. She exhibits no distension and no mass.  Neurological: She is alert and oriented to person, place, and time.  Skin: Skin is warm and dry.  Psychiatric: She has a  normal mood and affect. Her behavior is normal. Judgment and thought content normal.  Vitals reviewed.    Female chaperone present for pelvic and breast  portions of the physical exam  Assessment: 41 y.o. [id number] routine annual exam  Plan: Problem List Items Addressed This Visit    None    Visit Diagnoses    Acute vaginitis    -  Primary   Relevant Orders   NuSwab BV and Candida, NAA   Screening for breast cancer       Relevant Orders   MM DIGITAL SCREENING BILATERAL   Health care maintenance       Vulvar itching          1) Mammogram - recommend yearly screening mammogram.  Mammogram Was ordered today  2) STI screening was offered and declined  3) ASCCP guidelines and rational discussed.  Patient opts for every 5 years screening interval  4) Vulvar itching, recommended discontinuation  of washing perineum with soap. Discussed sitz baths twice a day with application of Vaseline after as part of a soak and seal regimen. Prescribed night time antihistamine. If no improvement of symptoms can consider adding a clobetasol cream, etc. Testing for vaginitis sent.  5) Routine healthcare maintenance including cholesterol, diabetes screening discussed managed by PCP  6) Return for follow up in 1 month.

## 2017-12-08 ENCOUNTER — Other Ambulatory Visit: Payer: Self-pay | Admitting: Obstetrics and Gynecology

## 2017-12-08 LAB — NUSWAB BV AND CANDIDA, NAA
CANDIDA GLABRATA, NAA: NEGATIVE
Candida albicans, NAA: POSITIVE — AB

## 2017-12-08 MED ORDER — FLUCONAZOLE 150 MG PO TABS
150.0000 mg | ORAL_TABLET | Freq: Once | ORAL | 0 refills | Status: AC
Start: 2017-12-08 — End: 2017-12-08

## 2017-12-08 NOTE — Progress Notes (Signed)
I called patient, but could not reach her. Can you attempt to contact her as well? I am sending a prescription to her pharmacy for the yeast infection.

## 2017-12-09 NOTE — Progress Notes (Signed)
Hi Sara Pacheco, the diflucan is there. It is under the history tab in medications. Because it is a one time medicine it falls off quickly.

## 2018-01-06 ENCOUNTER — Ambulatory Visit: Payer: 59 | Admitting: Obstetrics and Gynecology

## 2018-01-06 ENCOUNTER — Encounter: Payer: Self-pay | Admitting: Obstetrics and Gynecology

## 2018-01-06 VITALS — BP 126/80 | HR 62 | Wt 118.0 lb

## 2018-01-06 DIAGNOSIS — N760 Acute vaginitis: Secondary | ICD-10-CM | POA: Diagnosis not present

## 2018-01-06 DIAGNOSIS — N941 Unspecified dyspareunia: Secondary | ICD-10-CM | POA: Diagnosis not present

## 2018-01-06 DIAGNOSIS — L292 Pruritus vulvae: Secondary | ICD-10-CM | POA: Diagnosis not present

## 2018-01-06 MED ORDER — TERCONAZOLE 0.8 % VA CREA: 1.0000 | TOPICAL_CREAM | Freq: Every day | VAGINAL | 1 refills | Status: DC

## 2018-01-06 MED ORDER — PRASTERONE 6.5 MG VA INST: 6.5000 mg | VAGINAL_INSERT | Freq: Every day | VAGINAL | 11 refills | Status: DC

## 2018-01-12 ENCOUNTER — Ambulatory Visit
Admission: RE | Admit: 2018-01-12 | Discharge: 2018-01-12 | Disposition: A | Discharge status: Home / Self Care | Payer: Managed Care, Other (non HMO) | Source: Ambulatory Visit | Attending: Obstetrics and Gynecology | Admitting: Obstetrics and Gynecology

## 2018-01-12 DIAGNOSIS — R928 Other abnormal and inconclusive findings on diagnostic imaging of breast: Secondary | ICD-10-CM | POA: Diagnosis not present

## 2018-01-12 DIAGNOSIS — Z1231 Encounter for screening mammogram for malignant neoplasm of breast: Secondary | ICD-10-CM | POA: Diagnosis present

## 2018-01-12 DIAGNOSIS — Z1239 Encounter for other screening for malignant neoplasm of breast: Secondary | ICD-10-CM

## 2018-01-14 ENCOUNTER — Other Ambulatory Visit: Payer: Self-pay | Admitting: *Deleted

## 2018-01-14 ENCOUNTER — Inpatient Hospital Stay
Admission: RE | Admit: 2018-01-14 | Discharge: 2018-01-14 | Disposition: A | Discharge status: Home / Self Care | Payer: Self-pay | Source: Ambulatory Visit | Attending: *Deleted | Admitting: *Deleted

## 2018-01-14 DIAGNOSIS — Z9289 Personal history of other medical treatment: Secondary | ICD-10-CM

## 2018-01-15 ENCOUNTER — Other Ambulatory Visit: Payer: Self-pay | Admitting: Obstetrics and Gynecology

## 2018-01-15 DIAGNOSIS — R928 Other abnormal and inconclusive findings on diagnostic imaging of breast: Secondary | ICD-10-CM

## 2018-01-15 DIAGNOSIS — N6489 Other specified disorders of breast: Secondary | ICD-10-CM

## 2018-01-15 NOTE — Progress Notes (Signed)
Called and let patient know result. Ordered placed for ultrasounds

## 2018-01-20 ENCOUNTER — Other Ambulatory Visit: Payer: Managed Care, Other (non HMO)

## 2018-01-20 ENCOUNTER — Ambulatory Visit: Payer: Managed Care, Other (non HMO)

## 2018-01-22 ENCOUNTER — Ambulatory Visit
Admission: RE | Admit: 2018-01-22 | Discharge: 2018-01-22 | Disposition: A | Discharge status: Home / Self Care | Payer: Managed Care, Other (non HMO) | Source: Ambulatory Visit | Attending: Obstetrics and Gynecology | Admitting: Obstetrics and Gynecology

## 2018-01-22 ENCOUNTER — Telehealth: Payer: Self-pay | Admitting: Obstetrics and Gynecology

## 2018-01-22 DIAGNOSIS — N6489 Other specified disorders of breast: Secondary | ICD-10-CM | POA: Insufficient documentation

## 2018-01-22 DIAGNOSIS — R928 Other abnormal and inconclusive findings on diagnostic imaging of breast: Secondary | ICD-10-CM

## 2018-01-22 NOTE — Progress Notes (Signed)
Called but no answer, can you please let this patient know results were normal and she will just need repeat Mammogram in 1 year. Thank you.

## 2018-01-22 NOTE — Telephone Encounter (Signed)
Please inform that her mammogram was negative. Thank you!

## 2018-01-22 NOTE — Progress Notes (Signed)
Called, no answer, [facility]

## 2018-01-22 NOTE — Telephone Encounter (Signed)
Pt is calling due to missed cal from Dr. Jerene PitchSchuman please advise

## 2018-01-28 ENCOUNTER — Other Ambulatory Visit: Payer: Managed Care, Other (non HMO)

## 2018-01-28 ENCOUNTER — Ambulatory Visit: Payer: Managed Care, Other (non HMO)

## 2018-01-29 NOTE — Progress Notes (Signed)
HPI:      [name] is a 42 y.o. [phone] who LMP was Patient's last menstrual period was 12/28/2017., presents today for a problem visit.  She complains of:  Vaginitis: Patient complains of an abnormal vaginal discharge for several weeks. Vaginal symptoms include local irritation and vulvar itching.Vulvar symptoms include vulvar itching.STI Risk: Very low risk of STD exposure. Discharge described as: white thick. Other associated symptoms: dyspareunia. Contraception: tubal Patient had a recent nuswab which was positive for yeast, but she did not pick up prescription.  PMHx: She  has a past medical history of Anxiety, Endometrial polyp (2009), Endometriosis, GERD (gastroesophageal reflux disease), and Preeclampsia, severe (1998). Also,  has a past surgical history that includes laparoscopy (12/20/2008); Hysteroscopy w/D&C (12/20/2008); Cesarean section w/btl (11/30/2002); Cesarean section (06/18/1997 & 07/07/2001); Dilatation & curettage/hysteroscopy with myosure (N/A, 10/02/2017); and Tubal ligation., family history includes COPD in her father; Colon cancer (age of onset: 6437) in her cousin; Colon cancer (age of onset: 260) in her paternal uncle; Congenital heart disease in her daughter; Ovarian cancer in her other; Stroke (age of onset: 3260) in her paternal grandfather.,  reports that  has never smoked. she has never used smokeless tobacco. She reports that she does not drink alcohol or use drugs.  She has a current medication list which includes the following prescription(s): cholecalciferol, prasterone, and terconazole. Also, is allergic to azithromycin and penicillin g.  Review of Systems  Constitutional: Negative for chills, fever, malaise/fatigue and weight loss.  HENT: Negative for congestion, hearing loss and sinus pain.   Eyes: Negative for blurred vision and double vision.  Respiratory: Negative for cough, sputum production, shortness of breath and wheezing.   Cardiovascular: Negative  for chest pain, palpitations, orthopnea and leg swelling.  Gastrointestinal: Negative for abdominal pain, constipation, diarrhea, nausea and vomiting.  Genitourinary: Negative for dysuria, flank pain, frequency, hematuria and urgency.  Musculoskeletal: Negative for back pain, falls and joint pain.  Skin: Negative for itching and rash.  Neurological: Negative for dizziness and headaches.  Psychiatric/Behavioral: Negative for depression, substance abuse and suicidal ideas. The patient is not nervous/anxious.     Objective: BP 126/80 (BP Location: Right Arm, Patient Position: Sitting, Cuff Size: Normal)   Pulse 62   Wt 118 lb (53.5 kg)   LMP 12/28/2017   BMI 21.24 kg/m  Physical Exam  Constitutional: She is oriented to person, place, and time. She appears well-developed.  Genitourinary: Uterus normal. There is no lesion on the right labia. There is no lesion on the left labia. Vagina exhibits no lesion. Right adnexum does not display mass. Left adnexum does not display mass. Cervix does not exhibit motion tenderness.  Genitourinary Comments: White vaginal discharge  HENT:  Head: Normocephalic and atraumatic.  Eyes: EOM are normal.  Neck: Neck supple. No thyromegaly present.  Cardiovascular: Normal rate, regular rhythm and normal heart sounds.  Pulmonary/Chest: Effort normal and breath sounds normal. Right breast exhibits no inverted nipple, no mass, no nipple discharge and no skin change. Left breast exhibits no inverted nipple, no mass, no nipple discharge and no skin change.  Abdominal: Soft. Bowel sounds are normal. She exhibits no distension and no mass.  Neurological: She is alert and oriented to person, place, and time.  Skin: Skin is warm and dry.  Psychiatric: She has a normal mood and affect. Her behavior is normal. Judgment and thought content normal.  Vitals reviewed.   ASSESSMENT/PLAN:    Problem List Items Addressed This Visit  Musculoskeletal and Integument    Vulvar itching    Other Visit Diagnoses    Acute vaginitis    -  Primary   Relevant Medications   terconazole (TERAZOL 3) 0.8 % vaginal cream   Dyspareunia, female       Relevant Medications   Prasterone (INTRAROSA) 6.5 MG INST     Makira Holleman MD 01/30/18 12:39 AM

## 2018-10-30 IMAGING — MG MM DIGITAL DIAGNOSTIC BILAT W/ TOMO W/ CAD
9 of 17 series · 9 of 40 positions shown · non-contrast
Comparison: Previous exam(s).

CLINICAL DATA: Bilateral breast asymmetry seen on most recent
screening mammography.

EXAM:
2D DIGITAL DIAGNOSTIC BILATERAL MAMMOGRAM WITH CAD AND ADJUNCT TOMO
ULTRASOUND LEFT BREAST

[L MLO (1 of 2)]
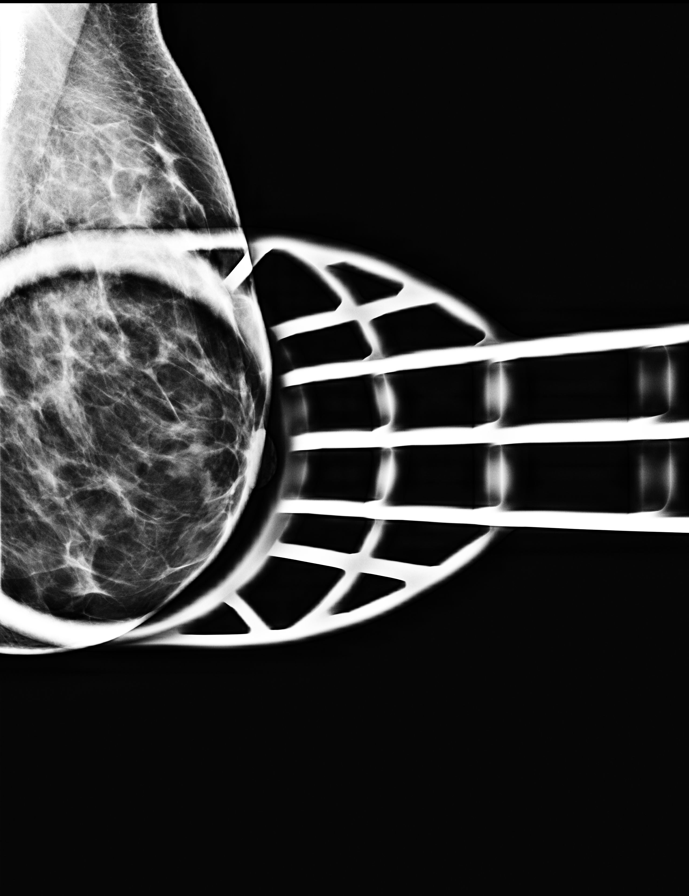

[L CC synth-2D]
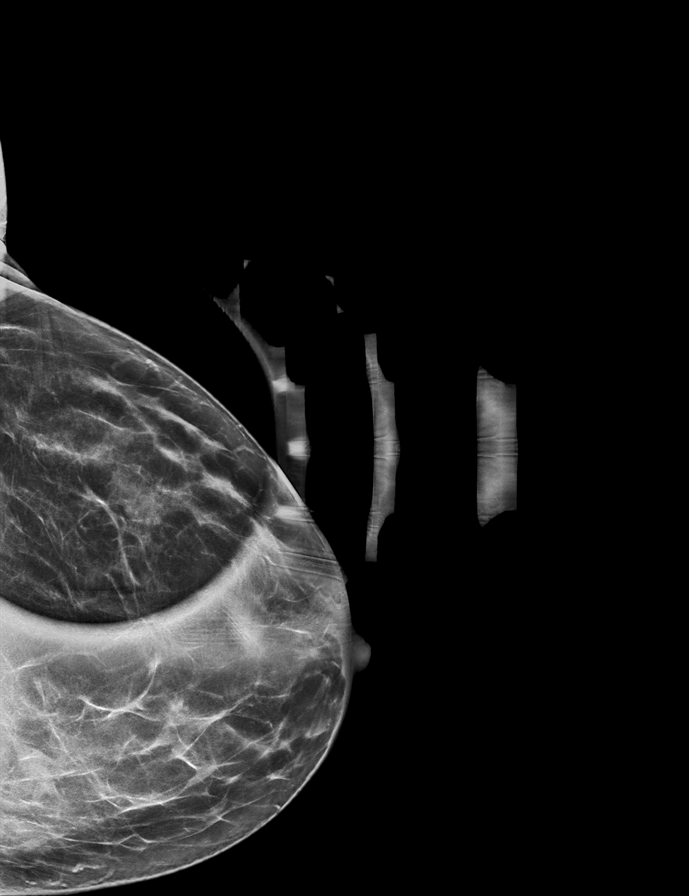

[R CC synth-2D]
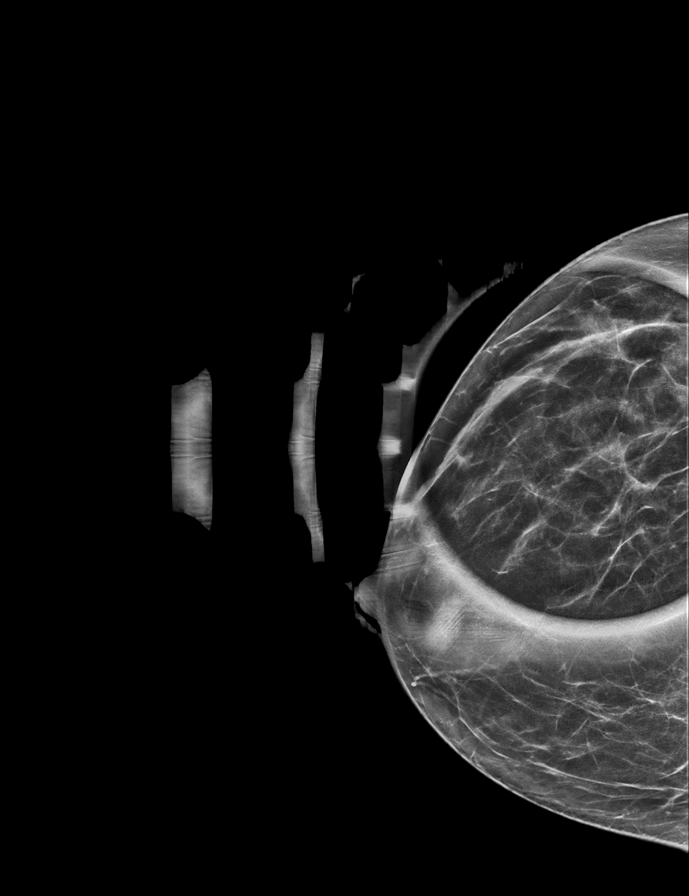

[R ML]
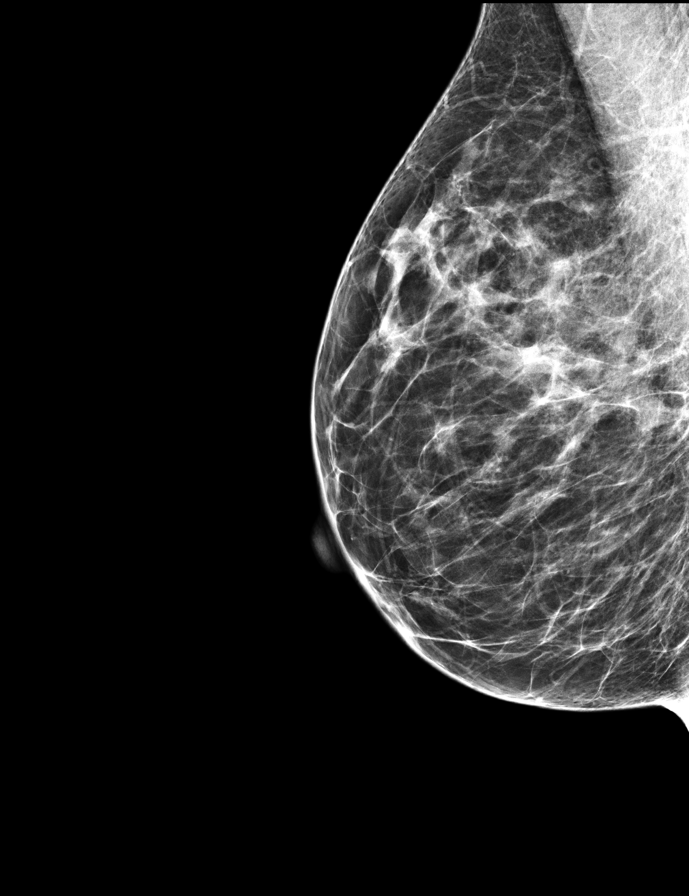

[R ML synth-2D]
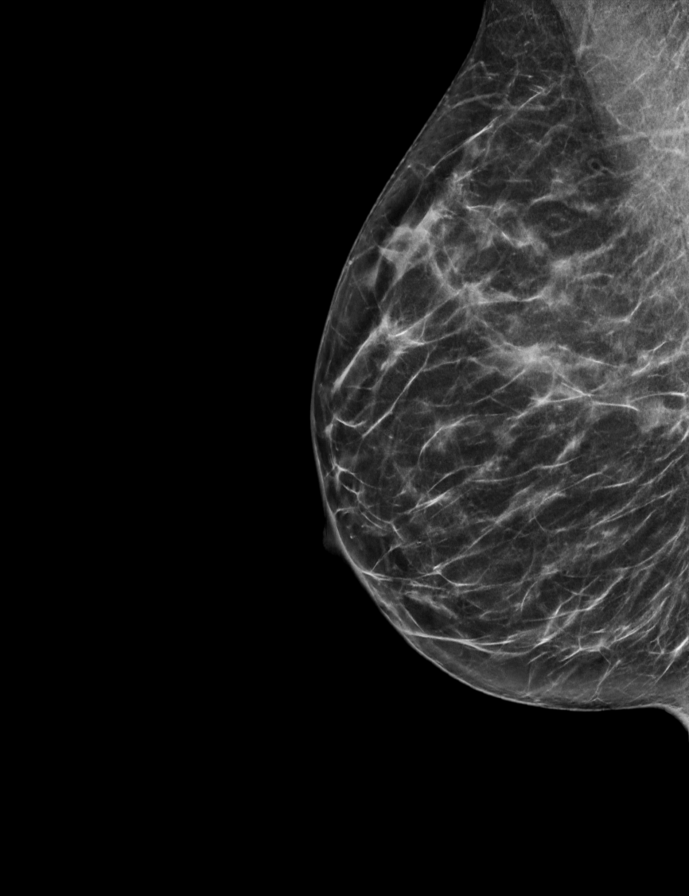

[L ML synth-2D]
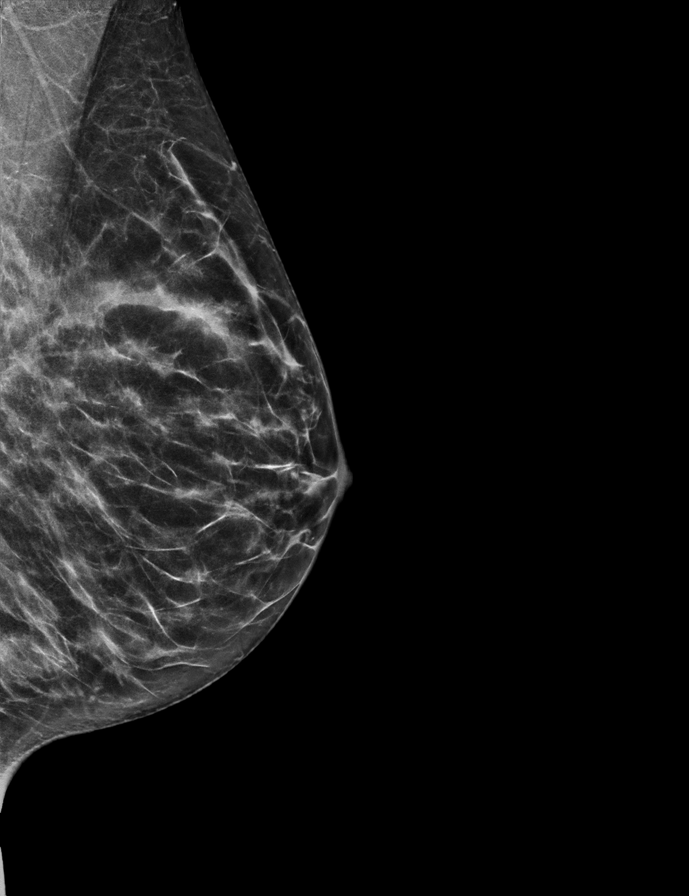

[L MLO (2 of 2)]
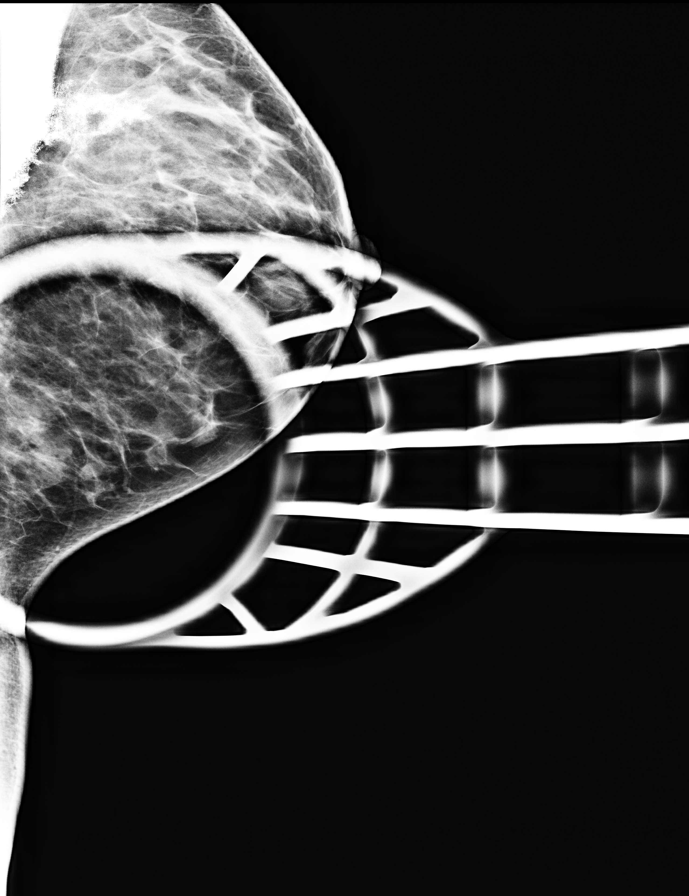

[L MLO synth-2D]
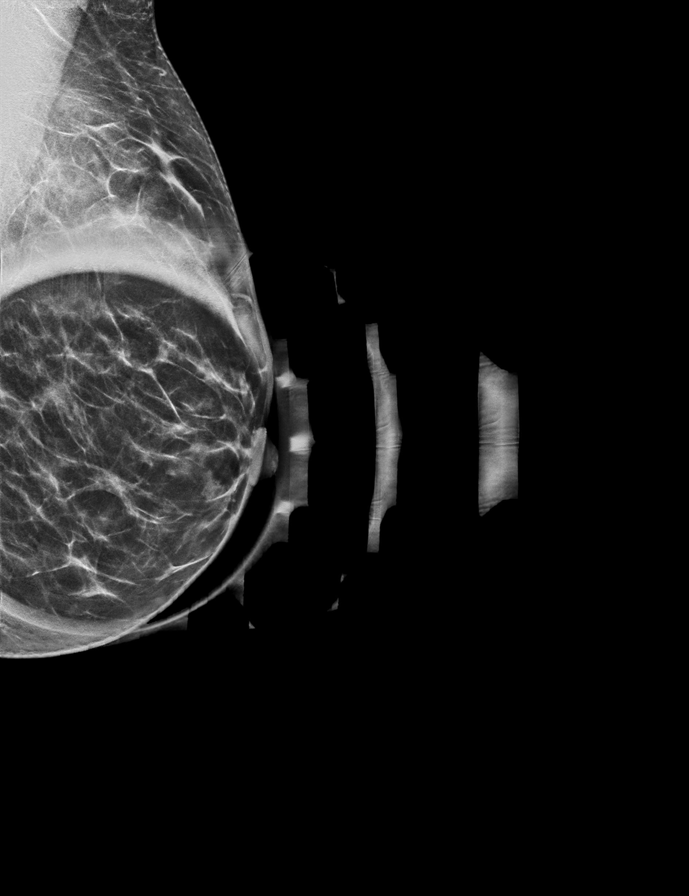

[R CC]
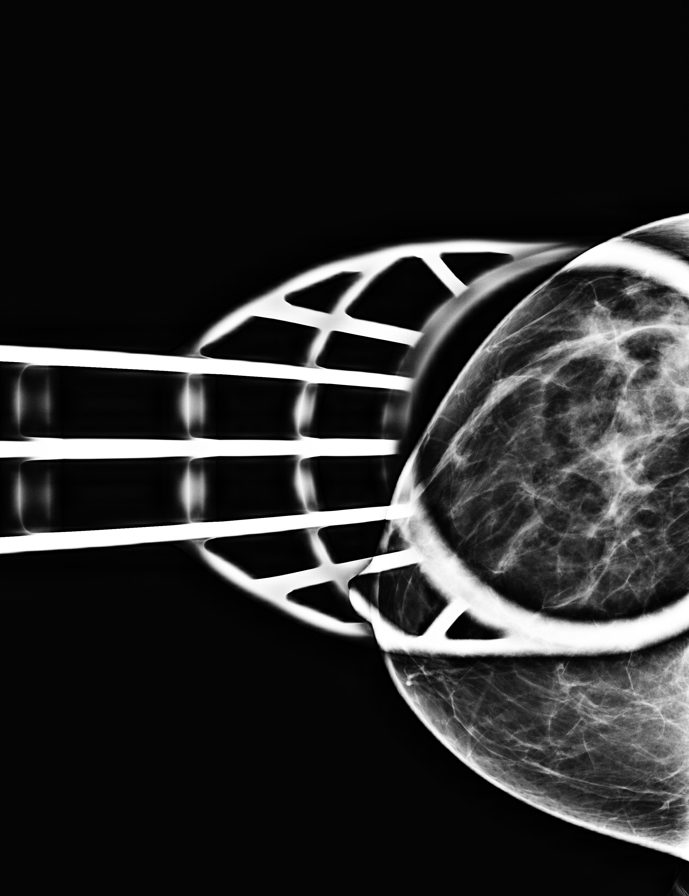

[9 of 40 positions shown; findings below may reference images not displayed]

ACR Breast Density Category b: There are scattered areas of
fibroglandular density.
FINDINGS: Mammographically, there are no suspicious masses, areas of
architectural distortion or microcalcifications in the either
breast. The previously seen asymmetries effaced to glandular tissue.

Mammographic images were processed with CAD.

On physical exam, no suspicious masses are palpated.

Targeted ultrasound is performed, showing no suspicious masses or
shadowing lesions.
IMPRESSION: No mammographic or sonographic evidence of malignancy in either
breast.

RECOMMENDATION:
Screening mammogram in one year.(Code:57-5-JUW)

I have discussed the findings and recommendations with the patient.
Results were also provided in writing at the conclusion of the
visit. If applicable, a reminder letter will be sent to the patient
regarding the next appointment.

BI-RADS CATEGORY  1: Negative.

## 2018-12-22 ENCOUNTER — Ambulatory Visit: Payer: Managed Care, Other (non HMO) | Admitting: Advanced Practice Midwife

## 2019-03-15 ENCOUNTER — Ambulatory Visit: Payer: Managed Care, Other (non HMO) | Admitting: Advanced Practice Midwife

## 2020-01-28 ENCOUNTER — Ambulatory Visit: Payer: Managed Care, Other (non HMO) | Admitting: Advanced Practice Midwife

## 2020-02-24 ENCOUNTER — Ambulatory Visit: Payer: Managed Care, Other (non HMO) | Admitting: Advanced Practice Midwife

## 2021-10-30 DIAGNOSIS — R87619 Unspecified abnormal cytological findings in specimens from cervix uteri: Secondary | ICD-10-CM

## 2021-10-30 HISTORY — DX: Unspecified abnormal cytological findings in specimens from cervix uteri: R87.619

## 2021-11-06 NOTE — Progress Notes (Signed)
PCP: Patient, No Pcp Per (Inactive)   Chief Complaint  Patient presents with   Gynecologic Exam    Last cycle was 3 days longer than usual    HPI:      [name] is a 45 y.o. [phone] whose LMP was Patient's last menstrual period was 10/30/2021 (exact date)., presents today for her NP> 3 yrs annual examination.  Her menses are regular every 28-30 days, lasting 7 days, light to mod flow, no BTB, mild dysmen, no meds needed. S/p endometrial ablation. Last few menses were a little heavier (still mod flow), lasting 7-10 days. No increased dysmen. Pt with hx of endometriosis and occas has non-menstrual pelvic pain. She does have vasomotor sx.   Sex activity: single partner, contraception - tubal ligation. She does have vaginal dryness, improved with lubricants.   Last Pap: 09/25/16 Results were: no abnormalities /neg HPV DNA.   Last mammogram: 01/22/18  Results were: normal--routine follow-up in 12 months There is no FH of breast cancer. There is no FH of ovarian cancer. The patient does do self-breast exams.  Colonoscopy: never. FH colon cancer on pat side with neg cancer genetic testing of affected cousin and his family.   Tobacco use: The patient denies current or previous tobacco use. Alcohol use: none No drug use. Exercise: very active  She does get adequate calcium but not Vitamin D in her diet.  Labs with PCP.   Past Medical History:  Diagnosis Date   Anxiety    NO MEDS   Endometrial polyp 2009   Endometriosis    GERD (gastroesophageal reflux disease)    no meds   Preeclampsia, severe 1998    Past Surgical History:  Procedure Laterality Date   CESAREAN SECTION  06/18/1997 & 07/07/2001   X3   CESAREAN SECTION W/BTL  11/30/2002   DILATATION & CURETTAGE/HYSTEROSCOPY WITH MYOSURE N/A 10/02/2017   Procedure: DILATATION & CURETTAGE/HYSTEROSCOPY WITH MYOSURE, POLYPECTOMY;  Surgeon: Will Bonnet, MD;  Location: [facility] ORS;  Service: Gynecology;  Laterality: N/A;    HYSTEROSCOPY WITH D & C  12/20/2008   endomterila polyps   LAPAROSCOPY  12/20/2008   CPP/ endometriosis/ lysis of adhesion   TUBAL LIGATION      Family History  Problem Relation Age of Onset   Ovarian cancer Other    COPD Father    Stroke Paternal Grandfather 3   Colon cancer Paternal Uncle 20       3 of 4 children were tested for genetic mutation-all negative   Colon cancer Cousin 34       siblings genetic tests were negative   Congenital heart disease Daughter        Tetrology of Fallot-surgically corrected age 37    Social History   Socioeconomic History   Marital status: Married    Spouse name: Not on file   Number of children: 3   Years of education: Not on file   Highest education level: Not on file  Occupational History   Not on file  Tobacco Use   Smoking status: Never   Smokeless tobacco: Never  Vaping Use   Vaping Use: Never used  Substance and Sexual Activity   Alcohol use: No   Drug use: No   Sexual activity: Yes    Birth control/protection: Surgical    Comment: tubal ligation  Other Topics Concern   Not on file  Social History Narrative   Not on file   Social Determinants of Health  Financial Resource Strain: Not on file  Food Insecurity: Not on file  Transportation Needs: Not on file  Physical Activity: Not on file  Stress: Not on file  Social Connections: Not on file  Intimate Partner Violence: Not on file     Current Outpatient Medications:    cholecalciferol (VITAMIN D) 1000 units tablet, Take 1,000 Units by mouth daily. (Patient not taking: Reported on 11/07/2021), Disp: , Rfl:      ROS:  Review of Systems  Constitutional:  Negative for fatigue, fever and unexpected weight change.  Respiratory:  Negative for cough, shortness of breath and wheezing.   Cardiovascular:  Negative for chest pain, palpitations and leg swelling.  Gastrointestinal:  Negative for blood in stool, constipation, diarrhea, nausea and vomiting.  Endocrine:  Negative for cold intolerance, heat intolerance and polyuria.  Genitourinary:  Negative for dyspareunia, dysuria, flank pain, frequency, genital sores, hematuria, menstrual problem, pelvic pain, urgency, vaginal bleeding, vaginal discharge and vaginal pain.  Musculoskeletal:  Negative for back pain, joint swelling and myalgias.  Skin:  Negative for rash.  Neurological:  Negative for dizziness, syncope, light-headedness, numbness and headaches.  Hematological:  Negative for adenopathy.  Psychiatric/Behavioral:  Negative for agitation, confusion, sleep disturbance and suicidal ideas. The patient is not nervous/anxious.   BREAST: No symptoms    Objective: BP 130/90   Ht 5\' 3"  (1.6 m)   Wt 142 lb (64.4 kg)   LMP 10/30/2021 (Exact Date)   BMI 25.15 kg/m    Physical Exam Constitutional:      Appearance: She is well-developed.  Genitourinary:     Vulva normal.     Right Labia: No rash, tenderness or lesions.    Left Labia: No tenderness, lesions or rash.    No vaginal discharge, erythema or tenderness.      Right Adnexa: not tender and no mass present.    Left Adnexa: not tender and no mass present.    No cervical friability or polyp.     Uterus is not enlarged or tender.  Breasts:    Right: No mass, nipple discharge, skin change or tenderness.     Left: No mass, nipple discharge, skin change or tenderness.  Neck:     Thyroid: No thyromegaly.  Cardiovascular:     Rate and Rhythm: Normal rate and regular rhythm.     Heart sounds: Normal heart sounds. No murmur heard. Pulmonary:     Effort: Pulmonary effort is normal.     Breath sounds: Normal breath sounds.  Abdominal:     Palpations: Abdomen is soft.     Tenderness: There is no abdominal tenderness. There is no guarding or rebound.  Musculoskeletal:        General: Normal range of motion.     Cervical back: Normal range of motion.  Lymphadenopathy:     Cervical: No cervical adenopathy.  Neurological:     General: No  focal deficit present.     Mental Status: She is alert and oriented to person, place, and time.     Cranial Nerves: No cranial nerve deficit.  Skin:    General: Skin is warm and dry.  Psychiatric:        Mood and Affect: Mood normal.        Behavior: Behavior normal.        Thought Content: Thought content normal.        Judgment: Judgment normal.  Vitals reviewed.    Assessment/Plan:  Encounter for annual routine gynecological examination  Cervical  cancer screening - Plan: Cytology - PAP  Screening for HPV (human papillomavirus) - Plan: Cytology - PAP  Encounter for screening mammogram for malignant neoplasm of breast - Plan: MM 3D SCREEN BREAST BILATERAL; pt to sched mammo  Menstrual changes--still WNL. Cont to follow cycles. F/u prn AUB or excessive bleeding. Will check GYN u/s.   Screening for colon cancer--colonoscopy/cologuard discussed. Pt declines for now. Will f/u if desires screening.   Family history of colon cancer--family with neg gene testing in past, may be candidate for updated testing. Pt currently doesn't qualify for [facility] testing but will cont to follow FH.           GYN counsel breast self exam, mammography screening, menopause, adequate intake of calcium and vitamin D, diet and exercise    F/U  Return in about 1 year (around 11/07/2022).  Yannick Steuber B. Dorsel Flinn, PA-C 11/07/2021 9:57 AM

## 2021-11-07 ENCOUNTER — Encounter: Payer: Self-pay | Admitting: Obstetrics and Gynecology

## 2021-11-07 ENCOUNTER — Other Ambulatory Visit (HOSPITAL_COMMUNITY)
Admission: RE | Admit: 2021-11-07 | Discharge: 2021-11-07 | Disposition: A | Discharge status: Home / Self Care | Payer: Managed Care, Other (non HMO) | Source: Ambulatory Visit | Attending: Obstetrics and Gynecology | Admitting: Obstetrics and Gynecology

## 2021-11-07 ENCOUNTER — Other Ambulatory Visit: Payer: Self-pay

## 2021-11-07 ENCOUNTER — Ambulatory Visit (INDEPENDENT_AMBULATORY_CARE_PROVIDER_SITE_OTHER): Payer: Managed Care, Other (non HMO) | Admitting: Obstetrics and Gynecology

## 2021-11-07 VITALS — BP 130/90 | Ht 63.0 in | Wt 142.0 lb

## 2021-11-07 DIAGNOSIS — Z124 Encounter for screening for malignant neoplasm of cervix: Secondary | ICD-10-CM | POA: Diagnosis present

## 2021-11-07 DIAGNOSIS — N926 Irregular menstruation, unspecified: Secondary | ICD-10-CM

## 2021-11-07 DIAGNOSIS — Z800 Family history of malignant neoplasm of digestive organs: Secondary | ICD-10-CM

## 2021-11-07 DIAGNOSIS — Z1211 Encounter for screening for malignant neoplasm of colon: Secondary | ICD-10-CM

## 2021-11-07 DIAGNOSIS — Z1151 Encounter for screening for human papillomavirus (HPV): Secondary | ICD-10-CM | POA: Insufficient documentation

## 2021-11-07 DIAGNOSIS — Z01419 Encounter for gynecological examination (general) (routine) without abnormal findings: Secondary | ICD-10-CM | POA: Diagnosis not present

## 2021-11-07 DIAGNOSIS — Z1231 Encounter for screening mammogram for malignant neoplasm of breast: Secondary | ICD-10-CM

## 2021-11-07 NOTE — Patient Instructions (Signed)
I value your feedback and you entrusting us with your care. If you get a [facility] patient survey, I would appreciate you taking the time to let us know about your experience today. Thank you!  [facility]: [phone]  [facility]: [phone]    

## 2021-11-09 LAB — CYTOLOGY - PAP
Comment: NEGATIVE
High risk HPV: NEGATIVE

## 2021-11-12 ENCOUNTER — Telehealth: Payer: Self-pay | Admitting: Obstetrics and Gynecology

## 2021-11-14 ENCOUNTER — Encounter: Payer: Self-pay | Admitting: Obstetrics and Gynecology

## 2021-11-27 NOTE — Telephone Encounter (Signed)
Pt aware of results and colpo sched

## 2021-12-13 ENCOUNTER — Encounter: Payer: Self-pay | Admitting: Obstetrics and Gynecology

## 2021-12-13 ENCOUNTER — Other Ambulatory Visit: Payer: Self-pay

## 2021-12-13 ENCOUNTER — Other Ambulatory Visit (HOSPITAL_COMMUNITY)
Admission: RE | Admit: 2021-12-13 | Discharge: 2021-12-13 | Disposition: A | Discharge status: Home / Self Care | Payer: Managed Care, Other (non HMO) | Source: Ambulatory Visit | Attending: Obstetrics and Gynecology | Admitting: Obstetrics and Gynecology

## 2021-12-13 ENCOUNTER — Ambulatory Visit (INDEPENDENT_AMBULATORY_CARE_PROVIDER_SITE_OTHER): Payer: Managed Care, Other (non HMO) | Admitting: Obstetrics and Gynecology

## 2021-12-13 VITALS — BP 124/70 | Ht 62.0 in | Wt 142.2 lb

## 2021-12-13 DIAGNOSIS — R87619 Unspecified abnormal cytological findings in specimens from cervix uteri: Secondary | ICD-10-CM

## 2021-12-13 NOTE — Progress Notes (Signed)
° °  GYNECOLOGY CLINIC COLPOSCOPY PROCEDURE NOTE  45 y.o. [id number] here for colposcopy for  atypical glandular cells NOS   pap smear on 11/07/2021. Discussed underlying role for HPV infection in the development of cervical dysplasia, its natural history and progression/regression, need for surveillance.  Is the patient  pregnant: No LMP: Patient's last menstrual period was 11/25/2021. Smoking status:  reports that she has never smoked. She has never used smokeless tobacco. Future fertility desired:  No  Patient given informed consent, signed copy in the chart, time out was performed.  The patient was position in dorsal lithotomy position. Speculum was placed the cervix was visualized.   After application of acetic acid colposcopic inspection of the cervix was undertaken.   Colposcopy adequate, full visualization of transformation zone: Yes acetowhite lesion(s) noted at 12 and 6 o'clock; corresponding biopsies obtained.   ECC specimen obtained:  Yes   [place] endometrial biopsy was performed using aseptic technique with iodine preparation. The uterus was sounded to a length of 8 cm.  Adequate sampling was obtained with minimal blood loss.  The patient tolerated the procedure well.  Disposition will be pending pathology.  All specimens were labeled and sent to pathology.   Patient was given post procedure instructions.  Will follow up pathology and manage accordingly.  Routine preventative health maintenance measures emphasized.  Physical Exam Genitourinary:       Adelene Idler MD [facility], [facility] 01/22/2022 9:22 AM

## 2021-12-13 NOTE — Patient Instructions (Signed)
Colposcopy, Care After The following information offers guidance on how to care for yourself after your procedure. Your doctor may also give you more specific instructions. If you have problems or questions, contact your doctor. What can I expect after the procedure? If you did not have a sample of your tissue taken out (did not have a biopsy), you may only have some spotting of blood for a few days. You can go back to your normal activities. If you had a sample of your tissue taken out, it is common to have: Soreness and mild pain. These may last for a few days. Mild bleeding or fluid (discharge) coming from your vagina. The fluid will look dark and grainy. You may have this for a few days. The fluid may be caused by a liquid that was used during your procedure. You may need to wear a sanitary pad. Spotting of blood for at least 48 hours after the procedure. Follow these instructions at home: Medicines Take over-the-counter and prescription medicines only as told by your doctor. Ask your doctor what over-the-counter pain medicines and prescription medicines you can start taking again. This is very important if you take blood thinners. Activity For at least 3 days, or for as long as told by your doctor, avoid: Douching. Using tampons. Having sex. Return to your normal activities as told by your doctor. Ask your doctor what activities are safe for you. General instructions Ask your doctor if you may take baths, swim, or use a hot tub. You may take showers. If you use birth control (contraception), keep using it. Keep all follow-up visits. Contact a doctor if: You have a fever or chills. You faint or feel light-headed. Get help right away if: You bleed a lot from your vagina. A lot of bleeding means that the bleeding soaks through a pad in less than 1 hour. You have clumps of blood (blood clots) coming from your vagina. You have signs that could mean you have an infection. This may be fluid  coming from your vagina that is: Different than normal. Yellow. Bad-smelling. You have very bad pain or cramps in your lower belly that do not get better with medicine. Summary If you did not have a sample of your tissue taken out, you may only have some spotting of blood for a few days. You can go back to your normal activities. If you had a sample of your tissue taken out, it is common to have mild pain for a few days and spotting for 48 hours. Avoid douching, using tampons, and having sex for at least 3 days after the procedure or for as long as told. Get help right away if you have a lot of bleeding, very bad pain, or signs of infection. This information is not intended to replace advice given to you by your health care provider. Make sure you discuss any questions you have with your health care provider. Document Revised: 05/13/2021 Document Reviewed: 05/13/2021 Elsevier Patient Education  2022 [employer]  

## 2021-12-17 LAB — SURGICAL PATHOLOGY
